# Patient Record
Sex: Female | Born: 1985 | Hispanic: Yes | Marital: Married | Smoking: Never smoker
Health system: Southern US, Community
[De-identification: ages and names within clinical notes are randomized; demographics above are authoritative.]

## PROBLEM LIST (undated history)

## (undated) HISTORY — PX: HERNIA REPAIR: SHX51

---

## 2015-07-27 ENCOUNTER — Emergency Department (HOSPITAL_COMMUNITY): Payer: No Typology Code available for payment source

## 2015-07-27 ENCOUNTER — Emergency Department (HOSPITAL_COMMUNITY)
Admission: EM | Admit: 2015-07-27 | Discharge: 2015-07-27 | Disposition: A | Discharge status: Home / Self Care | Payer: No Typology Code available for payment source | Attending: Emergency Medicine | Admitting: Emergency Medicine

## 2015-07-27 ENCOUNTER — Encounter (HOSPITAL_COMMUNITY): Payer: Self-pay | Admitting: Emergency Medicine

## 2015-07-27 DIAGNOSIS — Y998 Other external cause status: Secondary | ICD-10-CM | POA: Insufficient documentation

## 2015-07-27 DIAGNOSIS — Y9389 Activity, other specified: Secondary | ICD-10-CM | POA: Diagnosis not present

## 2015-07-27 DIAGNOSIS — S199XXA Unspecified injury of neck, initial encounter: Secondary | ICD-10-CM | POA: Insufficient documentation

## 2015-07-27 DIAGNOSIS — M545 Low back pain, unspecified: Secondary | ICD-10-CM

## 2015-07-27 DIAGNOSIS — Y92410 Unspecified street and highway as the place of occurrence of the external cause: Secondary | ICD-10-CM | POA: Diagnosis not present

## 2015-07-27 DIAGNOSIS — S3992XA Unspecified injury of lower back, initial encounter: Secondary | ICD-10-CM | POA: Insufficient documentation

## 2015-07-27 DIAGNOSIS — Z3A15 15 weeks gestation of pregnancy: Secondary | ICD-10-CM | POA: Insufficient documentation

## 2015-07-27 DIAGNOSIS — O9A212 Injury, poisoning and certain other consequences of external causes complicating pregnancy, second trimester: Secondary | ICD-10-CM | POA: Diagnosis not present

## 2015-07-27 DIAGNOSIS — M542 Cervicalgia: Secondary | ICD-10-CM
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
## 2015-07-27 NOTE — Discharge Instructions (Signed)
Follow up with your primary care doctor as needed.  Return to ER for new or worsening symptoms, any additional concerns.
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
